# Patient Record
Sex: Female | Born: 1995 | Race: Black or African American | Hispanic: No | Marital: Single | Smoking: Former smoker
Health system: Southern US, Community
[De-identification: ages and names within clinical notes are randomized; demographics above are authoritative.]

## PROBLEM LIST (undated history)

## (undated) ENCOUNTER — Inpatient Hospital Stay (HOSPITAL_COMMUNITY): Payer: Self-pay

## (undated) ENCOUNTER — Inpatient Hospital Stay: Payer: Self-pay

## (undated) DIAGNOSIS — Z789 Other specified health status: Secondary | ICD-10-CM

## (undated) HISTORY — PX: NO PAST SURGERIES: SHX2092

---

## 2016-04-26 ENCOUNTER — Emergency Department (HOSPITAL_COMMUNITY)
Admission: EM | Admit: 2016-04-26 | Discharge: 2016-04-26 | Disposition: A | Discharge status: Home / Self Care | Payer: BLUE CROSS/BLUE SHIELD | Attending: Emergency Medicine | Admitting: Emergency Medicine

## 2016-04-26 ENCOUNTER — Encounter (HOSPITAL_COMMUNITY): Payer: Self-pay | Admitting: *Deleted

## 2016-04-26 DIAGNOSIS — J069 Acute upper respiratory infection, unspecified: Secondary | ICD-10-CM | POA: Diagnosis not present

## 2016-04-26 DIAGNOSIS — R05 Cough: Secondary | ICD-10-CM | POA: Diagnosis present

## 2016-04-26 DIAGNOSIS — J0100 Acute maxillary sinusitis, unspecified: Secondary | ICD-10-CM | POA: Diagnosis not present

## 2016-04-26 DIAGNOSIS — F17200 Nicotine dependence, unspecified, uncomplicated: Secondary | ICD-10-CM | POA: Diagnosis not present

## 2016-04-26 LAB — INFLUENZA PANEL BY PCR (TYPE A & B)
H1N1 flu by pcr: NOT DETECTED
Influenza A By PCR: NEGATIVE
Influenza B By PCR: NEGATIVE

## 2016-04-26 LAB — RAPID STREP SCREEN (MED CTR MEBANE ONLY): STREPTOCOCCUS, GROUP A SCREEN (DIRECT): NEGATIVE

## 2016-04-26 MED ORDER — BENZONATATE 200 MG PO CAPS: 200.0000 mg | ORAL_CAPSULE | Freq: Three times a day (TID) | ORAL | 0 refills | Status: DC | PRN

## 2016-04-26 MED ORDER — NAPROXEN 375 MG PO TABS: 375.0000 mg | ORAL_TABLET | Freq: Two times a day (BID) | ORAL | 0 refills | Status: DC

## 2016-04-26 MED ORDER — IBUPROFEN 400 MG PO TABS
600.0000 mg | ORAL_TABLET | Freq: Once | ORAL | Status: AC
  Administered 2016-04-26: 600 mg via ORAL
  Filled 2016-04-26: qty 1

## 2016-04-26 NOTE — Discharge Instructions (Signed)
Take the medication for cough and the medication for fever and aching as needed. Follow up with your doctor or return for worsening symptoms.

## 2016-04-26 NOTE — ED Notes (Signed)
PA at bedside.

## 2016-04-26 NOTE — ED Provider Notes (Signed)
[facility]-EMERGENCY DEPT Provider Note   CSN: [id number] Arrival date & time: 04/26/16  1715  By signing my name below, I, Phillis HaggisGabriella Gaje, attest that this documentation has been prepared under the direction and in the presence of [facility] Ne[facility], NP-C. Electronically Signed: Phillis HaggisGabriella Gaje, ED Scribe. 04/26/16. 9:06 PM.  History   Chief Complaint Chief Complaint  Patient presents with  . Generalized Body Aches  . Cough  . URI   The history is provided by the patient. No language interpreter was used.  Cough  This is a new problem. The current episode started yesterday. The problem has been gradually worsening. The cough is productive of sputum. There has been no fever. Associated symptoms include chills, sore throat and myalgias. Pertinent negatives include no chest pain, no shortness of breath and no wheezing. She has tried nothing for the symptoms.   HPI Comments: Emma Watson is a 20 y.o. female who presents to the Emergency Department complaining of gradually worsening cough onset one day ago. She reports associated generalized myalgias, nausea, lower back pain, subjective fever, chills, fatigue and sore throat. Pt has a temperature of 99.3 F in the ED. She used peppermint tea and cough drops for her symptoms to mild relief. Pt has been around sick contacts at home. She says that she typically has similar symptoms around the same time every year. She denies vomiting or diarrhea.   History reviewed. No pertinent past medical history.  There are no active problems to display for this patient.   History reviewed. No pertinent surgical history.  OB History    No data available     Home Medications    Prior to Admission medications   Medication Sig Start Date End Date Taking? Authorizing Provider  benzonatate (TESSALON) 200 MG capsule Take 1 capsule (200 mg total) by mouth 3 (three) times daily as needed for cough. 04/26/16   Hope Orlene OchM Neese, NP  naproxen (NAPROSYN) 375 MG tablet Take 1  tablet (375 mg total) by mouth 2 (two) times daily. 04/26/16   Hope Orlene OchM Neese, NP    Family History History reviewed. No pertinent family history.  Social History Social History  Substance Use Topics  . Smoking status: Current Every Day Smoker  . Smokeless tobacco: Never Used  . Alcohol use Yes     Allergies   Review of patient's allergies indicates no known allergies.   Review of Systems Review of Systems  Constitutional: Positive for chills and fatigue. Negative for fever.  HENT: Positive for sore throat.   Respiratory: Positive for cough. Negative for shortness of breath and wheezing.   Cardiovascular: Negative for chest pain.  Gastrointestinal: Positive for nausea. Negative for diarrhea and vomiting.  Musculoskeletal: Positive for back pain and myalgias.  Neurological: Negative for dizziness and light-headedness.   Physical Exam Updated Vital Signs BP 109/68 (BP Location: Right Arm)   Pulse 80   Temp 99 F (37.2 C) (Oral)   Resp 20   LMP 04/15/2016 (Exact Date)   SpO2 99%   Physical Exam  Constitutional: She is oriented to person, place, and time. She appears well-developed and well-nourished.  HENT:  Head: Normocephalic and atraumatic.  Right Ear: Tympanic membrane normal.  Left Ear: Tympanic membrane normal.  Nose: Rhinorrhea present. Right sinus exhibits maxillary sinus tenderness. Left sinus exhibits maxillary sinus tenderness.  Mouth/Throat: Uvula is midline and mucous membranes are normal. Posterior oropharyngeal erythema present. No oropharyngeal exudate or posterior oropharyngeal edema.  Nasal congestion present   Eyes: EOM are  normal. Pupils are equal, round, and reactive to light.  Neck: Normal range of motion. Neck supple.  Slightly enlarged anterior cervical nodes  Cardiovascular: Normal rate and regular rhythm.   Pulmonary/Chest: Effort normal and breath sounds normal. No respiratory distress.  Abdominal: Soft. Bowel sounds are normal. There is no  tenderness.  Genitourinary: Rectal exam shows guaiac negative stool.  Musculoskeletal: Normal range of motion.  Scoliosis noted  Neurological: She is alert and oriented to person, place, and time.  Skin: Skin is warm and dry.  Psychiatric: She has a normal mood and affect. Her behavior is normal.  Nursing note and vitals reviewed.  ED Treatments / Results  DIAGNOSTIC STUDIES: Oxygen Saturation is 97% on RA, normal by my interpretation.    COORDINATION OF CARE: 7:36 PM-Discussed treatment plan which includes strep screen and flu test with pt at bedside and pt agreed to plan.   7:09 PM- went into room and pt was not yet placed in the room  7:21 PM- pt is now roomed  Labs (all labs ordered are listed, but only abnormal results are displayed) Labs Reviewed  RAPID STREP SCREEN (NOT AT [facility])  CULTURE, GROUP A STREP [facility])  INFLUENZA PANEL BY PCR (TYPE A & B, H1N1)    Radiology No results found.  Procedures Procedures (including critical care time)  Medications Ordered in ED Medications  ibuprofen (ADVIL,MOTRIN) tablet 600 mg (600 mg Oral Given 04/26/16 2018)   Initial Impression / Assessment and Plan / ED Course  I have reviewed the triage vital signs and the nursing notes.  Pertinent lab results that were available during my care of the patient were reviewed by me and considered in my medical decision making (see chart for details).  Clinical Course    Final Clinical Impressions(s) / ED Diagnoses   Pt strep screen and flu test is negative. Patients symptoms are consistent with URI, likely viral etiology. Discussed that antibiotics are not indicated for viral infections. Pt will be discharged with symptomatic treatment.  Verbalizes understanding and is agreeable with plan. Pt is hemodynamically stable & in NAD prior to dc.  Final diagnoses:  Viral upper respiratory tract infection   I personally performed the services described in this documentation, which was scribed in  my presence. The recorded information has been reviewed and is accurate.   New Prescriptions Discharge Medication List as of 04/26/2016  9:47 PM    START taking these medications   Details  benzonatate (TESSALON) 200 MG capsule Take 1 capsule (200 mg total) by mouth 3 (three) times daily as needed for cough., Starting Wed 04/26/2016, Print    naproxen (NAPROSYN) 375 MG tablet Take 1 tablet (375 mg total) by mouth 2 (two) times daily., Starting Wed 04/26/2016, [address] [place], NP 04/27/16 9147    Alvira Monday, MD 04/27/16 1309

## 2016-04-26 NOTE — ED Triage Notes (Signed)
Pt c/o cold symptoms starting today. Pt has not taken any OTC medications for her symptoms.

## 2016-04-26 NOTE — ED Notes (Signed)
Pt departed in NAD, refused use of wheelchair.  

## 2016-04-29 LAB — CULTURE, GROUP A STREP (THRC)

## 2016-08-19 ENCOUNTER — Ambulatory Visit (HOSPITAL_COMMUNITY)
Admission: EM | Admit: 2016-08-19 | Discharge: 2016-08-19 | Disposition: A | Discharge status: Home / Self Care | Payer: BLUE CROSS/BLUE SHIELD | Attending: Family Medicine | Admitting: Family Medicine

## 2016-08-19 ENCOUNTER — Encounter (HOSPITAL_COMMUNITY): Payer: Self-pay | Admitting: Emergency Medicine

## 2016-08-19 DIAGNOSIS — R69 Illness, unspecified: Secondary | ICD-10-CM

## 2016-08-19 DIAGNOSIS — M545 Low back pain, unspecified: Secondary | ICD-10-CM

## 2016-08-19 DIAGNOSIS — R05 Cough: Secondary | ICD-10-CM | POA: Diagnosis not present

## 2016-08-19 DIAGNOSIS — J111 Influenza due to unidentified influenza virus with other respiratory manifestations: Secondary | ICD-10-CM

## 2016-08-19 DIAGNOSIS — R059 Cough, unspecified: Secondary | ICD-10-CM

## 2016-08-19 MED ORDER — ONDANSETRON 4 MG PO TBDP: 4.0000 mg | ORAL_TABLET | Freq: Three times a day (TID) | ORAL | 0 refills | Status: DC | PRN

## 2016-08-19 MED ORDER — OSELTAMIVIR PHOSPHATE 75 MG PO CAPS: 75.0000 mg | ORAL_CAPSULE | Freq: Two times a day (BID) | ORAL | 0 refills | Status: DC

## 2016-08-19 MED ORDER — GUAIFENESIN-CODEINE 100-10 MG/5ML PO SYRP: 5.0000 mL | ORAL_SOLUTION | Freq: Three times a day (TID) | ORAL | 0 refills | Status: DC | PRN

## 2016-08-19 MED ORDER — ACETAMINOPHEN 325 MG PO TABS
650.0000 mg | ORAL_TABLET | Freq: Once | ORAL | Status: AC
  Administered 2016-08-19: 650 mg via ORAL

## 2016-08-19 MED ORDER — ACETAMINOPHEN 325 MG PO TABS
ORAL_TABLET | ORAL | Status: AC
  Filled 2016-08-19: qty 2

## 2016-08-19 NOTE — ED Triage Notes (Signed)
Vomiting and diarrhea, general aches and pain, headache

## 2016-08-19 NOTE — ED Provider Notes (Signed)
CSN: [id number]     Arrival date & time 08/19/16  1958 History   None    Chief Complaint  Patient presents with  . URI   (Consider location/radiation/quality/duration/timing/severity/associated sxs/prior Treatment) Patient presents with back pain Nausea fever and flu like sx's for 2 days   The history is provided by the patient.  URI  Presenting symptoms: congestion, cough, fatigue and fever   Severity:  Moderate Onset quality:  Sudden Duration:  2 days Timing:  Constant Progression:  Worsening Chronicity:  New Relieved by:  Nothing Ineffective treatments:  None tried Associated symptoms: arthralgias, headaches, myalgias and neck pain     History reviewed. No pertinent past medical history. History reviewed. No pertinent surgical history. No family history on file. Social History  Substance Use Topics  . Smoking status: Current Every Day Smoker  . Smokeless tobacco: Never Used  . Alcohol use Yes   OB History    No data available     Review of Systems  Constitutional: Positive for appetite change, chills, fatigue and fever.  HENT: Positive for congestion.   Eyes: Negative.   Respiratory: Positive for cough.   Cardiovascular: Negative.   Gastrointestinal: Positive for nausea.  Endocrine: Negative.   Genitourinary: Negative.   Musculoskeletal: Positive for arthralgias, back pain, myalgias and neck pain.  Allergic/Immunologic: Negative.   Neurological: Positive for headaches.  Hematological: Negative.   Psychiatric/Behavioral: Negative.     Allergies  Patient has no known allergies.  Home Medications   Prior to Admission medications   Medication Sig Start Date End Date Taking? Authorizing Provider  benzonatate (TESSALON) 200 MG capsule Take 1 capsule (200 mg total) by mouth 3 (three) times daily as needed for cough. Patient not taking: Reported on 08/19/2016 04/26/16   Janne Napoleon, NP  guaiFENesin-codeine (CHERATUSSIN AC) 100-10 MG/5ML syrup Take 5 mLs by  mouth 3 (three) times daily as needed for cough. 08/19/16   Deatra Canter, FNP  naproxen (NAPROSYN) 375 MG tablet Take 1 tablet (375 mg total) by mouth 2 (two) times daily. Patient not taking: Reported on 08/19/2016 04/26/16   Janne Napoleon, NP  oseltamivir (TAMIFLU) 75 MG capsule Take 1 capsule (75 mg total) by mouth every 12 (twelve) hours. 08/19/16   Deatra Canter, FNP   Meds Ordered and Administered this Visit   Medications  acetaminophen (TYLENOL) tablet 650 mg (650 mg Oral Given 08/19/16 2047)    BP 123/86 (BP Location: Right Arm)   Pulse 95   Temp 102.4 F (39.1 C) (Oral)   Resp 18   SpO2 100%  No data found.   Physical Exam  Constitutional: She is oriented to person, place, and time. She appears well-developed and well-nourished.  HENT:  Head: Normocephalic.  Right Ear: External ear normal.  Left Ear: External ear normal.  Mouth/Throat: Oropharynx is clear and moist.  Eyes: Conjunctivae and EOM are normal. Pupils are equal, round, and reactive to light.  Neck: Normal range of motion. Neck supple.  No nuchal rigidity  Cardiovascular: Normal rate, regular rhythm and normal heart sounds.   Pulmonary/Chest: Effort normal and breath sounds normal.  Abdominal: Soft. Bowel sounds are normal.  Musculoskeletal: She exhibits tenderness.  TTP lumbar paraspinous muscles.  Neurological: She is alert and oriented to person, place, and time.  Nursing note and vitals reviewed.   Urgent Care Course     Procedures (including critical care time)  Labs Review Labs Reviewed - No data to display  Imaging Review No results  found.   Visual Acuity Review  Right Eye Distance:   Left Eye Distance:   Bilateral Distance:    Right Eye Near:   Left Eye Near:    Bilateral Near:         MDM   1. Influenza-like illness   2. Cough   3. Back pain at L4-L5 level    Tylenol here Cheratussin cough syrup Zofran odt 4mg  Tamiflu Work note Push po fluids, rest, tylenol and  motrin otc prn as directed for fever, arthralgias, and myalgias.  Follow up prn if sx's continue or persist.    Deatra CanterWilliam J Oxford, FNP 08/19/16 2103

## 2017-06-22 ENCOUNTER — Encounter (HOSPITAL_COMMUNITY): Payer: Self-pay | Admitting: Emergency Medicine

## 2017-06-22 ENCOUNTER — Other Ambulatory Visit: Payer: Self-pay

## 2017-06-22 DIAGNOSIS — R05 Cough: Secondary | ICD-10-CM | POA: Insufficient documentation

## 2017-06-22 DIAGNOSIS — Z5321 Procedure and treatment not carried out due to patient leaving prior to being seen by health care provider: Secondary | ICD-10-CM | POA: Insufficient documentation

## 2017-06-22 DIAGNOSIS — M545 Low back pain: Secondary | ICD-10-CM | POA: Insufficient documentation

## 2017-06-22 DIAGNOSIS — R0981 Nasal congestion: Secondary | ICD-10-CM | POA: Insufficient documentation

## 2017-06-22 MED ORDER — ACETAMINOPHEN 325 MG PO TABS
650.0000 mg | ORAL_TABLET | Freq: Once | ORAL | Status: AC | PRN
  Administered 2017-06-22: 650 mg via ORAL
  Filled 2017-06-22: qty 2

## 2017-06-22 NOTE — ED Notes (Signed)
Denies sore throat

## 2017-06-22 NOTE — ED Triage Notes (Signed)
C/o productive cough with yellowish- green phlegm, nasal congestion, lower back aching since Tuesday.  Reports fever up to 103 on Wednesday.  Denies nausea.  States she vomited x 1 last night after coughing.

## 2017-06-23 ENCOUNTER — Other Ambulatory Visit: Payer: Self-pay

## 2017-06-23 ENCOUNTER — Encounter (HOSPITAL_COMMUNITY): Payer: Self-pay | Admitting: *Deleted

## 2017-06-23 ENCOUNTER — Emergency Department (HOSPITAL_COMMUNITY)
Admission: EM | Admit: 2017-06-23 | Discharge: 2017-06-23 | Disposition: A | Discharge status: Home / Self Care | Payer: Self-pay | Attending: Emergency Medicine | Admitting: Emergency Medicine

## 2017-06-23 ENCOUNTER — Emergency Department (HOSPITAL_COMMUNITY)
Admission: EM | Admit: 2017-06-23 | Discharge: 2017-06-23 | Disposition: A | Discharge status: Home / Self Care | Payer: BLUE CROSS/BLUE SHIELD | Attending: Emergency Medicine | Admitting: Emergency Medicine

## 2017-06-23 DIAGNOSIS — J40 Bronchitis, not specified as acute or chronic: Secondary | ICD-10-CM | POA: Insufficient documentation

## 2017-06-23 DIAGNOSIS — R51 Headache: Secondary | ICD-10-CM | POA: Insufficient documentation

## 2017-06-23 DIAGNOSIS — F17200 Nicotine dependence, unspecified, uncomplicated: Secondary | ICD-10-CM | POA: Insufficient documentation

## 2017-06-23 DIAGNOSIS — R059 Cough, unspecified: Secondary | ICD-10-CM

## 2017-06-23 DIAGNOSIS — Z79899 Other long term (current) drug therapy: Secondary | ICD-10-CM | POA: Insufficient documentation

## 2017-06-23 DIAGNOSIS — R0989 Other specified symptoms and signs involving the circulatory and respiratory systems: Secondary | ICD-10-CM | POA: Insufficient documentation

## 2017-06-23 DIAGNOSIS — R05 Cough: Secondary | ICD-10-CM | POA: Insufficient documentation

## 2017-06-23 DIAGNOSIS — R0981 Nasal congestion: Secondary | ICD-10-CM | POA: Insufficient documentation

## 2017-06-23 DIAGNOSIS — M791 Myalgia, unspecified site: Secondary | ICD-10-CM | POA: Insufficient documentation

## 2017-06-23 LAB — URINALYSIS, ROUTINE W REFLEX MICROSCOPIC
Bacteria, UA: NONE SEEN
Bilirubin Urine: NEGATIVE
Glucose, UA: 50 mg/dL — AB
Hgb urine dipstick: NEGATIVE
KETONES UR: 5 mg/dL — AB
LEUKOCYTES UA: NEGATIVE
Nitrite: NEGATIVE
PROTEIN: 100 mg/dL — AB
Specific Gravity, Urine: 1.035 — ABNORMAL HIGH (ref 1.005–1.030)
pH: 5 (ref 5.0–8.0)

## 2017-06-23 LAB — POC URINE PREG, ED: Preg Test, Ur: NEGATIVE

## 2017-06-23 MED ORDER — AZITHROMYCIN 250 MG PO TABS
500.0000 mg | ORAL_TABLET | Freq: Once | ORAL | Status: AC
  Administered 2017-06-23: 500 mg via ORAL
  Filled 2017-06-23: qty 2

## 2017-06-23 MED ORDER — ONDANSETRON 4 MG PO TBDP
4.0000 mg | ORAL_TABLET | Freq: Once | ORAL | Status: AC
  Administered 2017-06-23: 4 mg via ORAL
  Filled 2017-06-23: qty 1

## 2017-06-23 MED ORDER — AZITHROMYCIN 250 MG PO TABS: ORAL_TABLET | ORAL | 0 refills | Status: DC

## 2017-06-23 MED ORDER — ONDANSETRON 4 MG PO TBDP: ORAL_TABLET | ORAL | 0 refills | Status: DC

## 2017-06-23 NOTE — ED Provider Notes (Signed)
[facility]   CSN: [id number] Arrival date & time: 06/23/17  2124     History   Chief Complaint Chief Complaint  Patient presents with  . Cough    HPI Emma Watson is a 21 y.o. female.  Patient complains of cough congestion for a few days   The history is provided by the patient. No language interpreter was used.  Cough  This is a new problem. The current episode started more than 2 days ago. The problem occurs constantly. The problem has not changed since onset.The cough is productive of sputum. The maximum temperature recorded prior to her arrival was 100 to 100.9 F. The fever has been present for 1 to 2 days. Associated symptoms include headaches and myalgias. Pertinent negatives include no chest pain.    History reviewed. No pertinent past medical history.  There are no active problems to display for this patient.   History reviewed. No pertinent surgical history.  OB History    No data available       Home Medications    Prior to Admission medications   Medication Sig Start Date End Date Taking? Authorizing Provider  azithromycin (ZITHROMAX Z-PAK) 250 MG tablet 1 po q day 06/23/17   Bethann BerkshireZammit, Mckynzi Cammon, MD  benzonatate (TESSALON) 200 MG capsule Take 1 capsule (200 mg total) by mouth 3 (three) times daily as needed for cough. Patient not taking: Reported on 08/19/2016 04/26/16   Janne NapoleonNeese, Hope M, NP  guaiFENesin-codeine [facility](CHERATUSSIN AC) 100-10 MG/5ML syrup Take 5 mLs by mouth 3 (three) times daily as needed for cough. 08/19/16   Deatra Canterxford, William J, FNP  naproxen (NAPROSYN) 375 MG tablet Take 1 tablet (375 mg total) by mouth 2 (two) times daily. Patient not taking: Reported on 08/19/2016 04/26/16   Janne NapoleonNeese, Hope M, NP  ondansetron (ZOFRAN ODT) 4 MG disintegrating tablet Take 1 tablet (4 mg total) by mouth every 8 (eight) hours as needed for nausea or vomiting. 08/19/16   Deatra Canterxford, William J, FNP  oseltamivir (TAMIFLU) 75 MG  capsule Take 1 capsule (75 mg total) by mouth every 12 (twelve) hours. 08/19/16   Deatra Canterxford, William J, FNP    Family History No family history on file.  Social History Social History   Tobacco Use  . Smoking status: Current Every Day Smoker  . Smokeless tobacco: Never Used  Substance Use Topics  . Alcohol use: Yes  . Drug use: Yes    Types: Marijuana     Allergies   Patient has no known allergies.   Review of Systems Review of Systems  Constitutional: Negative for appetite change and fatigue.  HENT: Negative for congestion, ear discharge and sinus pressure.   Eyes: Negative for discharge.  Respiratory: Positive for cough.   Cardiovascular: Negative for chest pain.  Gastrointestinal: Negative for abdominal pain and diarrhea.  Genitourinary: Negative for frequency and hematuria.  Musculoskeletal: Positive for myalgias. Negative for back pain.  Skin: Negative for rash.  Neurological: Positive for headaches. Negative for seizures.  Psychiatric/Behavioral: Negative for hallucinations.     Physical Exam Updated Vital Signs BP (!) 128/95   Pulse (!) 104   Temp 99.4 F (37.4 C)   Resp 16   Ht 5\' 6"  (1.676 m)   Wt 59 kg (130 lb)   LMP 05/28/2017   SpO2 98%   BMI 20.98 kg/m   Physical Exam  Constitutional: She is oriented to person, place, and time. She appears well-developed.  HENT:  Head: Normocephalic.  Eyes: Conjunctivae and EOM are normal. No scleral icterus.  Neck: Neck supple. No thyromegaly present.  Cardiovascular: Normal rate and regular rhythm. Exam reveals no gallop and no friction rub.  No murmur heard. Pulmonary/Chest: No stridor. She has no wheezes. She has rales. She exhibits no tenderness.  Abdominal: She exhibits no distension. There is no tenderness. There is no rebound.  Musculoskeletal: Normal range of motion. She exhibits no edema.  Lymphadenopathy:    She has no cervical adenopathy.  Neurological: She is oriented to person, place, and time.  She exhibits normal muscle tone. Coordination normal.  Skin: No rash noted. No erythema.  Psychiatric: She has a normal mood and affect. Her behavior is normal.     ED Treatments / Results  Labs (all labs ordered are listed, but only abnormal results are displayed) Labs Reviewed - No data to display  EKG  EKG Interpretation None       Radiology No results found.  Procedures Procedures (including critical care time)  Medications Ordered in ED Medications  azithromycin (ZITHROMAX) tablet 500 mg (500 mg Oral Given 06/23/17 2311)     Initial Impression / Assessment and Plan / ED Course  I have reviewed the triage vital signs and the nursing notes.  Pertinent labs & imaging results that were available during my care of the patient were reviewed by me and considered in my medical decision making (see chart for details).     Patient with a respiratory infection.  She will be placed on Zithromax and told to drink fluids and take Tylenol or Motrin for aches and fever  Final Clinical Impressions(s) / ED Diagnoses   Final diagnoses:  Cough  Bronchitis    ED Discharge Orders        Ordered    azithromycin (ZITHROMAX Z-PAK) 250 MG tablet     06/23/17 2321       Bethann BerkshireZammit, Raydan Schlabach, MD 06/23/17 2324

## 2017-06-23 NOTE — ED Notes (Signed)
Pt is called for xray in lobby but no answer

## 2017-06-23 NOTE — ED Triage Notes (Signed)
Cold cough with chest congestion and elevated temp productive  coough light yellow  lmp last month 2n

## 2017-06-23 NOTE — Discharge Instructions (Signed)
Drink plenty of fluids take Tylenol or Motrin for aches and fever follow-up in 3-4 days if not improving

## 2017-06-23 NOTE — ED Notes (Signed)
Multiple staff members attempted to call patient. No response [phone]. Pt removed from system.

## 2017-06-28 NOTE — ED Notes (Signed)
Pt. Called and requested a work note for her visit on 06/23/2017.  Work note left at [employer] [facility].  Pt. Informed where to pick up the Work note. 06/28/2017 15:43

## 2017-07-07 ENCOUNTER — Other Ambulatory Visit: Payer: Self-pay

## 2017-07-07 ENCOUNTER — Emergency Department (HOSPITAL_COMMUNITY)
Admission: EM | Admit: 2017-07-07 | Discharge: 2017-07-08 | Disposition: A | Discharge status: Home / Self Care | Payer: No Typology Code available for payment source | Attending: Emergency Medicine | Admitting: Emergency Medicine

## 2017-07-07 ENCOUNTER — Encounter (HOSPITAL_COMMUNITY): Payer: Self-pay

## 2017-07-07 DIAGNOSIS — F17200 Nicotine dependence, unspecified, uncomplicated: Secondary | ICD-10-CM | POA: Insufficient documentation

## 2017-07-07 DIAGNOSIS — T148XXA Other injury of unspecified body region, initial encounter: Secondary | ICD-10-CM | POA: Diagnosis not present

## 2017-07-07 DIAGNOSIS — Y939 Activity, unspecified: Secondary | ICD-10-CM | POA: Insufficient documentation

## 2017-07-07 DIAGNOSIS — Y998 Other external cause status: Secondary | ICD-10-CM | POA: Diagnosis not present

## 2017-07-07 DIAGNOSIS — S199XXA Unspecified injury of neck, initial encounter: Secondary | ICD-10-CM | POA: Diagnosis present

## 2017-07-07 DIAGNOSIS — Y92410 Unspecified street and highway as the place of occurrence of the external cause: Secondary | ICD-10-CM | POA: Diagnosis not present

## 2017-07-07 DIAGNOSIS — T07XXXA Unspecified multiple injuries, initial encounter: Secondary | ICD-10-CM

## 2017-07-07 LAB — BASIC METABOLIC PANEL
Anion gap: 12 (ref 5–15)
BUN: 7 mg/dL (ref 6–20)
CHLORIDE: 101 mmol/L (ref 101–111)
CO2: 24 mmol/L (ref 22–32)
CREATININE: 0.68 mg/dL (ref 0.44–1.00)
Calcium: 9.2 mg/dL (ref 8.9–10.3)
GFR calc Af Amer: >60 mL/min (ref >60)
GFR calc non Af Amer: >60 mL/min (ref >60)
GLUCOSE: 82 mg/dL (ref 65–99)
POTASSIUM: 3 mmol/L — AB (ref 3.5–5.1)
Sodium: 137 mmol/L (ref 135–145)

## 2017-07-07 LAB — I-STAT TROPONIN, ED: Troponin i, poc: 0 ng/mL (ref 0.00–0.08)

## 2017-07-07 LAB — I-STAT BETA HCG BLOOD, ED (MC, WL, AP ONLY): I-stat hCG, quantitative: <5 m[IU]/mL (ref <5)

## 2017-07-07 LAB — CBC
HEMATOCRIT: 38.6 % (ref 36.0–46.0)
Hemoglobin: 12.6 g/dL (ref 12.0–15.0)
MCH: 30.3 pg (ref 26.0–34.0)
MCHC: 32.6 g/dL (ref 30.0–36.0)
MCV: 92.8 fL (ref 78.0–100.0)
PLATELETS: 311 10*3/uL (ref 150–400)
RBC: 4.16 MIL/uL (ref 3.87–5.11)
RDW: 14.7 % (ref 11.5–15.5)
WBC: 10.2 10*3/uL (ref 4.0–10.5)

## 2017-07-07 MED ORDER — IBUPROFEN 200 MG PO TABS
600.0000 mg | ORAL_TABLET | Freq: Once | ORAL | Status: AC
  Administered 2017-07-08: 600 mg via ORAL
  Filled 2017-07-07: qty 1

## 2017-07-07 MED ORDER — HYDROCODONE-ACETAMINOPHEN 5-325 MG PO TABS
1.0000 | ORAL_TABLET | Freq: Once | ORAL | Status: AC
  Administered 2017-07-08: 1 via ORAL
  Filled 2017-07-07: qty 1
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
## 2017-07-07 NOTE — ED Notes (Signed)
Patient transported to X-ray 

## 2017-07-07 NOTE — ED Notes (Signed)
EMT currently drawing labs 

## 2017-07-08 ENCOUNTER — Emergency Department (HOSPITAL_COMMUNITY): Payer: No Typology Code available for payment source

## 2017-07-08 LAB — D-DIMER, QUANTITATIVE (NOT AT ARMC)

## 2017-07-08 MED ORDER — IBUPROFEN 600 MG PO TABS: 600.0000 mg | ORAL_TABLET | Freq: Four times a day (QID) | ORAL | 0 refills | Status: DC | PRN

## 2017-07-08 MED ORDER — POTASSIUM CHLORIDE CRYS ER 20 MEQ PO TBCR
40.0000 meq | EXTENDED_RELEASE_TABLET | Freq: Once | ORAL | Status: AC
  Administered 2017-07-08: 40 meq via ORAL
  Filled 2017-07-08: qty 2
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
## 2017-07-08 NOTE — Discharge Instructions (Signed)
Your x-rays are negative.  Take the anti-inflammatory as prescribed.  Follow-up with your doctor.  Return to the ED if you develop new or worsening symptoms.

## 2018-01-26 ENCOUNTER — Encounter (HOSPITAL_COMMUNITY): Payer: Self-pay | Admitting: *Deleted

## 2018-01-26 ENCOUNTER — Inpatient Hospital Stay (HOSPITAL_COMMUNITY)
Admission: AD | Admit: 2018-01-26 | Discharge: 2018-01-26 | Disposition: A | Discharge status: Home / Self Care | Payer: Self-pay | Source: Ambulatory Visit | Attending: Obstetrics and Gynecology | Admitting: Obstetrics and Gynecology

## 2018-01-26 ENCOUNTER — Inpatient Hospital Stay (HOSPITAL_COMMUNITY): Payer: Self-pay

## 2018-01-26 DIAGNOSIS — O4690 Antepartum hemorrhage, unspecified, unspecified trimester: Secondary | ICD-10-CM

## 2018-01-26 DIAGNOSIS — O209 Hemorrhage in early pregnancy, unspecified: Secondary | ICD-10-CM | POA: Insufficient documentation

## 2018-01-26 DIAGNOSIS — O4691 Antepartum hemorrhage, unspecified, first trimester: Secondary | ICD-10-CM

## 2018-01-26 DIAGNOSIS — Z3A01 Less than 8 weeks gestation of pregnancy: Secondary | ICD-10-CM | POA: Insufficient documentation

## 2018-01-26 DIAGNOSIS — O418X91 Other specified disorders of amniotic fluid and membranes, unspecified trimester, fetus 1: Secondary | ICD-10-CM

## 2018-01-26 DIAGNOSIS — O468X1 Other antepartum hemorrhage, first trimester: Secondary | ICD-10-CM

## 2018-01-26 DIAGNOSIS — O418X90 Other specified disorders of amniotic fluid and membranes, unspecified trimester, not applicable or unspecified: Secondary | ICD-10-CM

## 2018-01-26 DIAGNOSIS — A5901 Trichomonal vulvovaginitis: Secondary | ICD-10-CM | POA: Insufficient documentation

## 2018-01-26 DIAGNOSIS — Z87891 Personal history of nicotine dependence: Secondary | ICD-10-CM | POA: Insufficient documentation

## 2018-01-26 DIAGNOSIS — O98311 Other infections with a predominantly sexual mode of transmission complicating pregnancy, first trimester: Secondary | ICD-10-CM | POA: Insufficient documentation

## 2018-01-26 DIAGNOSIS — O468X9 Other antepartum hemorrhage, unspecified trimester: Secondary | ICD-10-CM

## 2018-01-26 HISTORY — DX: Other specified health status: Z78.9

## 2018-01-26 LAB — COMPREHENSIVE METABOLIC PANEL
ALBUMIN: 3.9 g/dL (ref 3.5–5.0)
ALK PHOS: 63 U/L (ref 38–126)
ALT: 12 U/L (ref 0–44)
ANION GAP: 8 (ref 5–15)
AST: 21 U/L (ref 15–41)
BILIRUBIN TOTAL: 0.8 mg/dL (ref 0.3–1.2)
BUN: 7 mg/dL (ref 6–20)
CALCIUM: 8.9 mg/dL (ref 8.9–10.3)
CO2: 23 mmol/L (ref 22–32)
Chloride: 105 mmol/L (ref 98–111)
Creatinine, Ser: 0.73 mg/dL (ref 0.44–1.00)
GFR calc Af Amer: >60 mL/min (ref >60)
GLUCOSE: 94 mg/dL (ref 70–99)
POTASSIUM: 3.7 mmol/L (ref 3.5–5.1)
Sodium: 136 mmol/L (ref 135–145)
TOTAL PROTEIN: 7.4 g/dL (ref 6.5–8.1)

## 2018-01-26 LAB — CBC WITH DIFFERENTIAL/PLATELET
BASOS PCT: 0 %
Basophils Absolute: 0 10*3/uL (ref 0.0–0.1)
Eosinophils Absolute: 0.1 10*3/uL (ref 0.0–0.7)
Eosinophils Relative: 1 %
HEMATOCRIT: 38.5 % (ref 36.0–46.0)
HEMOGLOBIN: 12.8 g/dL (ref 12.0–15.0)
LYMPHS ABS: 2.3 10*3/uL (ref 0.7–4.0)
LYMPHS PCT: 24 %
MCH: 30.8 pg (ref 26.0–34.0)
MCHC: 33.2 g/dL (ref 30.0–36.0)
MCV: 92.5 fL (ref 78.0–100.0)
Monocytes Absolute: 0.3 10*3/uL (ref 0.1–1.0)
Monocytes Relative: 3 %
NEUTROS ABS: 6.8 10*3/uL (ref 1.7–7.7)
NEUTROS PCT: 72 %
Platelets: 249 10*3/uL (ref 150–400)
RBC: 4.16 MIL/uL (ref 3.87–5.11)
RDW: 14.5 % (ref 11.5–15.5)
WBC: 9.5 10*3/uL (ref 4.0–10.5)

## 2018-01-26 LAB — URINALYSIS, ROUTINE W REFLEX MICROSCOPIC
Bilirubin Urine: NEGATIVE
GLUCOSE, UA: NEGATIVE mg/dL
Ketones, ur: NEGATIVE mg/dL
Nitrite: NEGATIVE
PH: 5 (ref 5.0–8.0)
Protein, ur: NEGATIVE mg/dL
SPECIFIC GRAVITY, URINE: 1.02 (ref 1.005–1.030)
WBC, UA: >50 WBC/hpf — ABNORMAL HIGH (ref 0–5)

## 2018-01-26 LAB — WET PREP, GENITAL
Clue Cells Wet Prep HPF POC: NONE SEEN
SPERM: NONE SEEN
YEAST WET PREP: NONE SEEN

## 2018-01-26 LAB — HCG, QUANTITATIVE, PREGNANCY: hCG, Beta Chain, Quant, S: 20361 m[IU]/mL — ABNORMAL HIGH (ref <5)

## 2018-01-26 LAB — POCT PREGNANCY, URINE: Preg Test, Ur: POSITIVE — AB

## 2018-01-26 MED ORDER — METRONIDAZOLE 500 MG PO TABS
2000.0000 mg | ORAL_TABLET | Freq: Once | ORAL | Status: AC
  Administered 2018-01-26: 2000 mg via ORAL
  Filled 2018-01-26: qty 4

## 2018-01-26 NOTE — MAU Provider Note (Signed)
History     CSN: [id number]  Arrival date and time: 01/26/18 2039   First Provider Initiated Contact with Patient 01/26/18 2115      Chief Complaint  Patient presents with  . Vaginal Bleeding    [name] is a 22 y.o. female G1P0 @ 33w0dHere in MAU with vaginal bleeding.   Vaginal Bleeding  The patient's primary symptoms include vaginal bleeding. This is a new problem. The current episode started today. The problem occurs 2 to 4 times per day. The problem has been waxing and waning. The patient is experiencing no pain. She is pregnant. Pertinent negatives include no abdominal pain. The vaginal discharge was bloody and dark. The vaginal bleeding is spotting. She has not been passing clots. She has not been passing tissue. Nothing aggravates the symptoms. She has tried nothing for the symptoms. She uses nothing for contraception. Her menstrual history has been regular.    OB History    Gravida  1   Para      Term      Preterm      AB      Living        SAB      TAB      Ectopic      Multiple      Live Births              Past Medical History:  Diagnosis Date  . Medical history non-contributory     Past Surgical History:  Procedure Laterality Date  . NO PAST SURGERIES      No family history on file.  Social History   Tobacco Use  . Smoking status: Former Smoker    Types: Cigars    Last attempt to quit: 01/21/2018    Years since quitting: 0.0  . Smokeless tobacco: Never Used  Substance Use Topics  . Alcohol use: Not Currently  . Drug use: Not Currently    Types: Marijuana    Allergies: No Known Allergies  Medications Prior to Admission  Medication Sig Dispense Refill Last Dose  . azithromycin (ZITHROMAX Z-PAK) 250 MG tablet 1 po q day 4 tablet 0   . benzonatate (TESSALON) 200 MG capsule Take 1 capsule (200 mg total) by mouth 3 (three) times daily as needed for cough. (Patient not taking: Reported on 08/19/2016) 20 capsule 0 Not Taking  at Unknown time  . guaiFENesin-codeine (CHERATUSSIN AC) 100-10 MG/5ML syrup Take 5 mLs by mouth 3 (three) times daily as needed for cough. 120 mL 0   . ibuprofen (ADVIL,MOTRIN) 600 MG tablet Take 1 tablet (600 mg total) by mouth every 6 (six) hours as needed. 30 tablet 0   . naproxen (NAPROSYN) 375 MG tablet Take 1 tablet (375 mg total) by mouth 2 (two) times daily. (Patient not taking: Reported on 08/19/2016) 20 tablet 0 Not Taking at Unknown time  . ondansetron (ZOFRAN ODT) 4 MG disintegrating tablet 430mODT q4 hours prn nausea/vomit 10 tablet 0   . oseltamivir (TAMIFLU) 75 MG capsule Take 1 capsule (75 mg total) by mouth every 12 (twelve) hours. 10 capsule 0    Results for orders placed or performed during the hospital encounter of 01/26/18 (from the past 48 hour(s))  Urinalysis, Routine w reflex microscopic     Status: Abnormal   Collection Time: 01/26/18  8:52 PM  Result Value Ref Range   Color, Urine YELLOW YELLOW   APPearance HAZY (A) CLEAR   Specific Gravity, Urine 1.020 1.005 -  1.030   pH 5.0 5.0 - 8.0   Glucose, UA NEGATIVE NEGATIVE mg/dL   Hgb urine dipstick MODERATE (A) NEGATIVE   Bilirubin Urine NEGATIVE NEGATIVE   Ketones, ur NEGATIVE NEGATIVE mg/dL   Protein, ur NEGATIVE NEGATIVE mg/dL   Nitrite NEGATIVE NEGATIVE   Leukocytes, UA LARGE (A) NEGATIVE   RBC / HPF 21-50 0 - 5 RBC/hpf   WBC, UA >50 (H) 0 - 5 WBC/hpf   Bacteria, UA RARE (A) NONE SEEN   Squamous Epithelial / LPF 0-5 0 - 5   Mucus PRESENT     Comment: Performed at [facility], [address], [place], [place] [address]  Pregnancy, urine POC     Status: Abnormal   Collection Time: 01/26/18  9:19 PM  Result Value Ref Range   Preg Test, Ur POSITIVE (A) NEGATIVE    Comment:        THE SENSITIVITY OF THIS METHODOLOGY IS >24 mIU/mL   Wet prep, genital     Status: Abnormal   Collection Time: 01/26/18  9:23 PM  Result Value Ref Range   Yeast Wet Prep HPF POC NONE SEEN NONE SEEN   Trich, Wet Prep PRESENT (A)  NONE SEEN   Clue Cells Wet Prep HPF POC NONE SEEN NONE SEEN   WBC, Wet Prep HPF POC FEW (A) NONE SEEN    Comment: MODERATE BACTERIA SEEN   Sperm NONE SEEN     Comment: Performed at [facility], [address] [place], [place] [address]  CBC with Differential/Platelet     Status: None   Collection Time: 01/26/18  9:31 PM  Result Value Ref Range   WBC 9.5 4.0 - 10.5 K/uL   RBC 4.16 3.87 - 5.11 MIL/uL   Hemoglobin 12.8 12.0 - 15.0 g/dL   HCT 38.5 36.0 - 46.0 %   MCV 92.5 78.0 - 100.0 fL   MCH 30.8 26.0 - 34.0 pg   MCHC 33.2 30.0 - 36.0 g/dL   RDW 14.5 11.5 - 15.5 %   Platelets 249 150 - 400 K/uL   Neutrophils Relative % 72 %   Neutro Abs 6.8 1.7 - 7.7 K/uL   Lymphocytes Relative 24 %   Lymphs Abs 2.3 0.7 - 4.0 K/uL   Monocytes Relative 3 %   Monocytes Absolute 0.3 0.1 - 1.0 K/uL   Eosinophils Relative 1 %   Eosinophils Absolute 0.1 0.0 - 0.7 K/uL   Basophils Relative 0 %   Basophils Absolute 0.0 0.0 - 0.1 K/uL    Comment: Performed at [facility], [address] [place],  [address]  Comprehensive metabolic panel     Status: None   Collection Time: 01/26/18  9:31 PM  Result Value Ref Range   Sodium 136 135 - 145 mmol/L   Potassium 3.7 3.5 - 5.1 mmol/L   Chloride 105 98 - 111 mmol/L    Comment: Please note change in reference range.   CO2 23 22 - 32 mmol/L   Glucose, Bld 94 70 - 99 mg/dL    Comment: Please note change in reference range.   BUN 7 6 - 20 mg/dL    Comment: Please note change in reference range.   Creatinine, Ser 0.73 0.44 - 1.00 mg/dL   Calcium 8.9 8.9 - 10.3 mg/dL   Total Protein 7.4 6.5 - 8.1 g/dL   Albumin 3.9 3.5 - 5.0 g/dL   AST 21 15 - 41 U/L   ALT 12 0 - 44 U/L  Comment: Please note change in reference range.   Alkaline Phosphatase 63 38 - 126 U/L   Total Bilirubin 0.8 0.3 - 1.2 mg/dL   GFR calc non Af Amer >60 >60 mL/min   GFR calc Af Amer >60 >60 mL/min    Comment: (NOTE) The eGFR has been calculated using the CKD EPI  equation. This calculation has not been validated in all clinical situations. eGFR's persistently <60 mL/min signify possible Chronic Kidney Disease.    Anion gap 8 5 - 15    Comment: Performed at [facility], [address], [place], [place] [address]  ABO/Rh     Status: None (Preliminary result)   Collection Time: 01/26/18  9:31 PM  Result Value Ref Range   ABO/RH(D)      O POS Performed at [facility], [address], [place], [place] [address]   hCG, quantitative, pregnancy     Status: Abnormal   Collection Time: 01/26/18  9:31 PM  Result Value Ref Range   hCG, Beta Chain, Quant, S 20,361 (H) <5 mIU/mL    Comment:          GEST. AGE      CONC.  (mIU/mL)   <=1 WEEK        5 - 50     2 WEEKS       50 - 500     3 WEEKS       100 - 10,000     4 WEEKS     1,000 - 30,000     5 WEEKS     3,500 - 115,000   6-8 WEEKS     12,000 - 270,000    12 WEEKS     15,000 - 220,000        FEMALE AND NON-PREGNANT FEMALE:     LESS THAN 5 mIU/mL Performed at [facility], [address] [place], [place] [address]    US Ob Less Than 14 Weeks With Ob Transvaginal  Result Date: 01/26/2018 CLINICAL DATA:  Vaginal bleeding pending HCG EXAM: OBSTETRIC <14 WK [place] AND TRANSVAGINAL OB US TECHNIQUE: Both transabdominal and transvaginal ultrasound examinations were performed for complete evaluation of the gestation as well as the maternal uterus, adnexal regions, and pelvic cul-de-sac. Transvaginal technique was performed to assess early pregnancy. COMPARISON:  None. FINDINGS: Intrauterine gestational sac: Single intrauterine gestational sac. Yolk sac:  Visible Embryo:  Not visible MSD: 12.6 mm   6 w   0 d Subchorionic hemorrhage: Small subchorionic hemorrhage inferior aspect of the sac. Maternal uterus/adnexae: Ovaries are within normal limits. No adnexal mass. No significant free fluid IMPRESSION: Single intrauterine gestational sac and yolk sac but no embryo. Suggest follow-up ultrasound in 10-14  days to confirm viability. Small subchorionic hemorrhage Electronically Signed   By: Donavan Foil M.D.   On: 01/26/2018 22:12   Review of Systems  Gastrointestinal: Negative for abdominal pain.  Genitourinary: Positive for vaginal bleeding.   Physical Exam   Blood pressure 119/61, pulse 83, temperature 98.1 F (36.7 C), resp. rate 18, height 5' 6"  (1.676 m), weight 112 lb (50.8 kg), last menstrual period 12/08/2017.  Physical Exam  Constitutional: She is oriented to person, place, and time. She appears well-developed and well-nourished. No distress.  HENT:  Head: Normocephalic.  Eyes: Pupils are equal, round, and reactive to light.  GI: Soft. She exhibits no distension. There is no tenderness. There is no rebound and no guarding.  Genitourinary:  Genitourinary Comments: Vagina - Small amount of dark  red blood in the vaginal canal, no odor  Cervix - No contact bleeding, no active bleeding  Bimanual exam: Cervix closed, posterior  Uterus non tender, enlarged  Adnexa non tender, no masses bilaterally GC/Chlam, wet prep done Chaperone present for exam.   Musculoskeletal: Normal range of motion.  Neurological: She is alert and oriented to person, place, and time.  Skin: Skin is warm. She is not diaphoretic.  Psychiatric: Her behavior is normal.   MAU Course  Procedures  None  MDM  O positive blood type Flagyl 2 grams given in MAU for trichomonas   Assessment and Plan   A:  1. Trichomonas vaginitis   2. Vaginal bleeding in pregnancy   3. [redacted] weeks gestation of pregnancy   4. Subchorionic hemorrhage, antepartum     P:  Discharge home with bleeding precautions Pelvic rest Return if symptoms worsen Discussed [place] in detail with patient Partner needs treatment/ condoms always   Aidenn Skellenger, Artist Pais, NP 01/26/2018 11:11 PM

## 2018-01-26 NOTE — MAU Note (Signed)
Spotted some at 1300 today. Saw more tonight at 2000. Has had some cramping off and on in past. No pain since Weds

## 2018-01-26 NOTE — Discharge Instructions (Signed)
Subchorionic Hematoma A subchorionic hematoma is a gathering of blood between the outer wall of the placenta and the inner wall of the womb (uterus). The placenta is the organ that connects the fetus to the wall of the uterus. The placenta performs the feeding, breathing (oxygen to the fetus), and waste removal (excretory work) of the fetus. Subchorionic hematoma is the most common abnormality found on a result from ultrasonography done during the first trimester or early second trimester of pregnancy. If there has been little or no vaginal bleeding, early small hematomas usually shrink on their own and do not affect your baby or pregnancy. The blood is gradually absorbed over 1-2 weeks. When bleeding starts later in pregnancy or the hematoma is larger or occurs in an older pregnant woman, the outcome may not be as good. Larger hematomas may get bigger, which increases the chances for miscarriage. Subchorionic hematoma also increases the risk of premature detachment of the placenta from the uterus, preterm (premature) labor, and stillbirth. Follow these instructions at home:  Stay on bed rest if your health care provider recommends this. Although bed rest will not prevent more bleeding or prevent a miscarriage, your health care provider may recommend bed rest until you are advised otherwise.  Avoid heavy lifting (more than 10 lb [4.5 kg]), exercise, sexual intercourse, or douching as directed by your health care provider.  Keep track of the number of pads you use each day and how soaked (saturated) they are. Write down this information.  Do not use tampons.  Keep all follow-up appointments as directed by your health care provider. Your health care provider may ask you to have follow-up blood tests or ultrasound tests or both. Get help right away if:  You have severe cramps in your stomach, back, abdomen, or pelvis.  You have a fever.  You pass large clots or tissue. Save any tissue for your  health care provider to look at.  Your bleeding increases or you become lightheaded, feel weak, or have fainting episodes. This information is not intended to replace advice given to you by your health care provider. Make sure you discuss any questions you have with your health care provider. Document Released: 10/25/2006 Document Revised: 12/16/2015 Document Reviewed: 02/06/2013 Elsevier Interactive Patient Education  2017 [employer] Trichomonas Test Why am I having this test? The trichomonas test is done to diagnose trichomoniasis, an infection caused by an organism called Trichomonas. Trichomoniasis is a sexually transmitted infection (STI). In women, it causes vaginal infections. In men, it can cause the tube that carries urine (urethra) to become inflamed (urethritis). You may have this test as a part of a routine screening for STIs or if you have symptoms of trichomoniasis. To perform the test, your health care provider will take a sample of discharge. The sample is taken from the vagina or cervix in women and from the urethra in men. A urine sample can also be used for testing. What do the results mean? It is your responsibility to obtain your test results. Ask the lab or department performing the test when and how you will get your results. Contact your health care provider to discuss any questions you have about your results. Negative test results A negative test means you do not have trichomoniasis. Follow your health care provider's directions about any follow-up testing. Positive test results A positive test result means you have an active infection that needs to be treated with antibiotic medicine. All your current sexual partners must also be  treated or it is likely you will get reinfected. If your test is positive, your health care provider will start you on medicine and may advise you to:  Not have sexual intercourse until your infection has cleared up.  Use a latex condom  properly every time you have sexual intercourse.  Limit the number of sexual partners you have. The more partners you have, the greater your risk of contracting trichomoniasis or another STI.  Tell all sexual partners about your infection so they can also be treated and to prevent reinfection.  Talk with your health care provider to discuss your results, treatment options, and if necessary, the need for more tests. Talk with your health care provider if you have any questions about your results. This information is not intended to replace advice given to you by your health care provider. Make sure you discuss any questions you have with your health care provider. Document Released: 08/12/2004 Document Revised: 02/09/2016 Document Reviewed: 07/22/2013 Elsevier Interactive Patient Education  2017 [employer]

## 2018-01-27 LAB — ABO/RH: ABO/RH(D): O POS

## 2018-01-27 LAB — HIV ANTIBODY (ROUTINE TESTING W REFLEX): HIV SCREEN 4TH GENERATION: NONREACTIVE

## 2018-01-28 LAB — CULTURE, OB URINE: SPECIAL REQUESTS: NORMAL

## 2018-01-28 LAB — GC/CHLAMYDIA PROBE AMP (~~LOC~~) NOT AT ARMC
Chlamydia: NEGATIVE
Neisseria Gonorrhea: NEGATIVE

## 2018-04-30 ENCOUNTER — Encounter: Payer: Self-pay | Admitting: Obstetrics and Gynecology

## 2018-04-30 ENCOUNTER — Other Ambulatory Visit: Payer: Self-pay

## 2018-04-30 ENCOUNTER — Observation Stay: Payer: Medicaid Other

## 2018-04-30 ENCOUNTER — Observation Stay
Admission: EM | Admit: 2018-04-30 | Discharge: 2018-04-30 | Disposition: A | Discharge status: Home / Self Care | Payer: Medicaid Other | Attending: Certified Nurse Midwife | Admitting: Certified Nurse Midwife

## 2018-04-30 DIAGNOSIS — O23592 Infection of other part of genital tract in pregnancy, second trimester: Secondary | ICD-10-CM | POA: Insufficient documentation

## 2018-04-30 DIAGNOSIS — O26859 Spotting complicating pregnancy, unspecified trimester: Secondary | ICD-10-CM | POA: Diagnosis present

## 2018-04-30 DIAGNOSIS — O26852 Spotting complicating pregnancy, second trimester: Principal | ICD-10-CM | POA: Insufficient documentation

## 2018-04-30 DIAGNOSIS — O0932 Supervision of pregnancy with insufficient antenatal care, second trimester: Secondary | ICD-10-CM | POA: Diagnosis not present

## 2018-04-30 DIAGNOSIS — Z3A20 20 weeks gestation of pregnancy: Secondary | ICD-10-CM | POA: Diagnosis not present

## 2018-04-30 DIAGNOSIS — Z87891 Personal history of nicotine dependence: Secondary | ICD-10-CM | POA: Diagnosis not present

## 2018-04-30 DIAGNOSIS — O4690 Antepartum hemorrhage, unspecified, unspecified trimester: Secondary | ICD-10-CM

## 2018-04-30 DIAGNOSIS — B9689 Other specified bacterial agents as the cause of diseases classified elsewhere: Secondary | ICD-10-CM | POA: Diagnosis not present

## 2018-04-30 DIAGNOSIS — O23599 Infection of other part of genital tract in pregnancy, unspecified trimester: Secondary | ICD-10-CM

## 2018-04-30 LAB — WET PREP, GENITAL
Sperm: NONE SEEN
Trich, Wet Prep: NONE SEEN
Yeast Wet Prep HPF POC: NONE SEEN

## 2018-04-30 LAB — URINALYSIS, COMPLETE (UACMP) WITH MICROSCOPIC
BILIRUBIN URINE: NEGATIVE
Bacteria, UA: NONE SEEN
Glucose, UA: NEGATIVE mg/dL
HGB URINE DIPSTICK: NEGATIVE
Ketones, ur: 5 mg/dL — AB
LEUKOCYTES UA: NEGATIVE
NITRITE: NEGATIVE
PH: 6 (ref 5.0–8.0)
Protein, ur: NEGATIVE mg/dL
SPECIFIC GRAVITY, URINE: 1.027 (ref 1.005–1.030)

## 2018-04-30 LAB — CHLAMYDIA/NGC RT PCR (ARMC ONLY)
CHLAMYDIA TR: NOT DETECTED
N GONORRHOEAE: NOT DETECTED

## 2018-04-30 MED ORDER — METRONIDAZOLE 500 MG PO TABS: 500.0000 mg | ORAL_TABLET | Freq: Two times a day (BID) | ORAL | 0 refills | Status: AC

## 2018-04-30 MED ORDER — METRONIDAZOLE 500 MG PO TABS
500.0000 mg | ORAL_TABLET | Freq: Two times a day (BID) | ORAL | Status: DC
  Administered 2018-04-30: 500 mg via ORAL
  Filled 2018-04-30: qty 1

## 2018-04-30 NOTE — Final Progress Note (Signed)
Physician Final Progress Note  Patient ID: Emma Watson MRN: [id number] DOB/AGE: 1995-08-30 22 y.o.  Admit date: 04/30/2018 Admitting provider: Natale Milch, MD/ Gasper Lloyd. Sharen Hones, CNM Discharge date: 04/30/2018   Admission Diagnoses: Vaginal spotting in pregnancy Back pain and abdominal pain in pregnancy. No prenatal care  Discharge Diagnoses:  Active Problems:   Spotting in pregnancy   Bacterial vaginosis in pregnancy   No prenatal care in current pregnancy in second trimester   Consults: None  Significant Findings/ Diagnostic Studies: 22 year old G1 P0 with EDC=09/14/2018 by unsure LMP presented with complaints of vaginal spotting last night, and intermittent abdominal pain and back pain. She had not yet started her prenatal care-has had some ambivalent feeling regarding the pregnancy.  No bleeding was seen on vaginal exam and there were clue cells on wet prep. GC and Chlamydia test were negative Urinalysis was negative. Anatomy scan was done  and CGA was 18wk6d which changed her EDC to 09/24/2017. Anatomy scan was normal but there were suboptimal views of the spine. Placenta is anterior. Blood type is O pOS Results for orders placed or performed during the hospital encounter of 04/30/18 (from the past 24 hour(s))  Wet prep, genital     Status: Abnormal   Collection Time: 04/30/18  7:43 AM  Result Value Ref Range   Yeast Wet Prep HPF POC NONE SEEN NONE SEEN   Trich, Wet Prep NONE SEEN NONE SEEN   Clue Cells Wet Prep HPF POC PRESENT (A) NONE SEEN   WBC, Wet Prep HPF POC FEW (A) NONE SEEN   Sperm NONE SEEN   Chlamydia/NGC rt PCR ([facility] only)     Status: None   Collection Time: 04/30/18  7:43 AM  Result Value Ref Range   Specimen source GC/Chlam ENDOCERVICAL    Chlamydia Tr NOT DETECTED NOT DETECTED   N gonorrhoeae NOT DETECTED NOT DETECTED  Urinalysis, Complete w Microscopic     Status: Abnormal   Collection Time: 04/30/18  7:43 AM  Result Value Ref Range   Color,  Urine YELLOW (A) YELLOW   APPearance CLEAR (A) CLEAR   Specific Gravity, Urine 1.027 1.005 - 1.030   pH 6.0 5.0 - 8.0   Glucose, UA NEGATIVE NEGATIVE mg/dL   Hgb urine dipstick NEGATIVE NEGATIVE   Bilirubin Urine NEGATIVE NEGATIVE   Ketones, ur 5 (A) NEGATIVE mg/dL   Protein, ur NEGATIVE NEGATIVE mg/dL   Nitrite NEGATIVE NEGATIVE   Leukocytes, UA NEGATIVE NEGATIVE   RBC / HPF 0-5 0 - 5 RBC/hpf   WBC, UA 0-5 0 - 5 WBC/hpf   Bacteria, UA NONE SEEN NONE SEEN   Squamous Epithelial / LPF 0-5 0 - 5   Mucus PRESENT   US Ob Comp + 14 Wk  Result Date: 04/30/2018 CLINICAL DATA:  Vaginal bleeding since yesterday morning EXAM: OBSTETRICAL ULTRASOUND >14 WKS FINDINGS: Number of Fetuses: 1 Heart Rate:  152 bpm Movement: Yes Presentation: Variable Previa: No Placental Location: Anterior Amniotic Fluid (Subjective): Normal Amniotic Fluid (Objective): Vertical pocket 3.6cm FETAL BIOMETRY BPD:  4.06cm 18w 2d HC:    15.71cm 18w ford AC:   14.28cm 19w 4d FL:   2.85cm 18w 5d Current Mean GA: 18w 6d [place] EDC: 09/25/2018 FETAL ANATOMY Lateral Ventricles: Appears normal Thalami/CSP: Appears normal Posterior Fossa:  Appears normal Nuchal Region: Appears normal   NFT= N/A Upper Lip: Appears normal Spine: Not visualized 4 Chamber Heart on Left: Appears normal LVOT: Appears normal RVOT: Appears normal Stomach on Left: Appears normal  3 Vessel Cord: Appears normal Cord Insertion site: Appears normal Kidneys: Appears normal Bladder: Appears normal Extremities: Appears normal Sex: Female Technically difficult due to: Spine difficult to visualize secondary to fetal position. Maternal Findings: Cervix:  Cervix is closed.  Cervical length 4 cm. IMPRESSION: 1. Single live intrauterine pregnancy as detailed above. 2. Spine difficult to visualize secondary to fetal position. Recommend follow-up focused ultrasound for evaluation of the spine. Electronically Signed   By: Elige Ko   On: 04/30/2018 10:38   A: IUP at 18wk 6days with  spotting last night possibly due to bacterial vaginosis No prenatal care   P: Started on Flagyl 500 mgm BID x 7 days-to take with food-no alcohol No intercourse x 7 days Nurses and provider discussed continuing prenatal vitamins and to go to [facility] to get Medicaid and to start prenatal care/WIC  Can  See social services at [facility] regarding getting resources during pregnancy and postpartum or if she decides to place baby for adoption     Procedures: Ultrasound  Discharge Condition: stable  Disposition: Discharge disposition: 01-Home or Self Care       Diet: Regular diet  Discharge Activity: Activity as tolerated   Allergies as of 04/30/2018   No Known Allergies     Medication List    TAKE these medications   metroNIDAZOLE 500 MG tablet Commonly known as:  FLAGYL Take 1 tablet (500 mg total) by mouth 2 (two) times daily.     Continue prenatal vitamins   Total time spent taking care of this patient: 20 minutes  Signed: Farrel Conners 04/30/2018, 11:02 AM

## 2018-04-30 NOTE — OB Triage Note (Signed)
Pt presents with c/o bleeding, starting yesterday, has noted "pink spotting." Pt also reports back pain and abdominal pain that comes and goes since Sunday. Pt tearful, expressing concern over pregnancy, doesn't feel like she's "ready to be a mom." Pt states she "has a lot of stress right now," indicating concern over relationship with FOB. Pt reports decreased fetal movement. Denies leaking of fluid. Toco applied and explained. Doppler: 147. All questions answered.

## 2018-04-30 NOTE — H&P (Signed)
Obstetric H&P   Chief Complaint: Abdominal pain, back pain, pink spotting  Prenatal Care Provider: None  History of Present Illness: 22 y.o. G1P0 [redacted]w[redacted]d by 09/14/2018, by Last Menstrual Period presenting to L&D for intermittent abdominal and back pain that started on Sunday. She also noticed some pink spotting when wiping last night. Denies dysuria and abnormal vaginal discharge. Expresses concern about social situation and relationship with FOB. No loss of fluid. Reports that fetal movement is decreased.   Review of Systems: Review of systems negative unless otherwise noted in HPI  Past Medical History: Past Medical History:  Diagnosis Date  . Medical history non-contributory     Past Surgical History: Past Surgical History:  Procedure Laterality Date  . NO PAST SURGERIES      Past Obstetric History: G1P0  Past Gynecologic History: Unknown  Family History: Unremarkable except as listed in HPI.  Social History: Social History   Socioeconomic History  . Marital status: Single    Spouse name: Not on file  . Number of children: Not on file  . Years of education: Not on file  . Highest education level: Not on file  Occupational History  . Occupation: unemployed  Social Needs  . Financial resource strain: Not on file  . Food insecurity:    Worry: Not on file    Inability: Not on file  . Transportation needs:    Medical: Not on file    Non-medical: Not on file  Tobacco Use  . Smoking status: Former Smoker    Types: Cigars    Last attempt to quit: 04/07/2018    Years since quitting: 0.0  . Smokeless tobacco: Never Used  Substance and Sexual Activity  . Alcohol use: Not Currently  . Drug use: Not Currently    Types: Marijuana  . Sexual activity: Yes  Lifestyle  . Physical activity:    Days per week: Not on file    Minutes per session: Not on file  . Stress: Not on file  Relationships  . Social connections:    Talks on phone: Not on file    Gets together: Not on  file    Attends religious service: Not on file    Active member of club or organization: Not on file    Attends meetings of clubs or organizations: Not on file    Relationship status: Not on file  . Intimate partner violence:    Fear of current or ex partner: Not on file    Emotionally abused: Not on file    Physically abused: Not on file    Forced sexual activity: Not on file  Other Topics Concern  . Not on file  Social History Narrative  . Not on file    Medications: Prior to Admission medications   Not on File    Allergies: No Known Allergies  Physical Exam: Vitals: Blood pressure 111/63, pulse 65, temperature 98.1 F (36.7 C), resp. rate 14, last menstrual period 12/08/2017.  FHT: 147 Toco: quiet  General: NAD HEENT: normocephalic, anicteric Pulmonary: No increased work of breathing Cardiovascular: Regular rate Abdomen: Gravid, non-tender Genitourinary: SSE: cervix closed, small amount of white discharge, no blood seen Extremities: no edema, erythema, or tenderness Neurologic: Grossly intact Psychiatric: mood appropriate, affect full   Assessment: 22 y.o. G1P0 [redacted]w[redacted]d by 09/14/2018, by Last Menstrual Period with abdominal and back pain and pink spotting.  Plan: 1) UA, urine culture, wet prep, GC to determine whether infectious source(s) causing pelvic/back pain.  2) Fetus -  movement appreciated in triage, normal FHR via Doppler.  3) PNL - Blood type --/--/O POS Performed at [facility], [address], [place], [place] [address]  [phone]) // HIV Non Reactive (07/06 2131)   4) Social work consult.  Marcelyn Bruins, CNM 04/30/2018  8:31 AM

## 2018-04-30 NOTE — Discharge Instructions (Signed)
Second Trimester of Pregnancy The second trimester is from week 13 through week 28, month 4 through 6. This is often the time in pregnancy that you feel your best. Often times, morning sickness has lessened or quit. You may have more energy, and you may get hungry more often. Your unborn baby (fetus) is growing rapidly. At the end of the sixth month, he or she is about 9 inches long and weighs about 1 pounds. You will likely feel the baby move (quickening) between 18 and 20 weeks of pregnancy. Follow these instructions at home:  Avoid all smoking, herbs, and alcohol. Avoid drugs not approved by your doctor.  Do not use any tobacco products, including cigarettes, chewing tobacco, and electronic cigarettes. If you need help quitting, ask your doctor. You may get counseling or other support to help you quit.  Only take medicine as told by your doctor. Some medicines are safe and some are not during pregnancy.  Exercise only as told by your doctor. Stop exercising if you start having cramps.  Eat regular, healthy meals.  Wear a good support bra if your breasts are tender.  Do not use hot tubs, steam rooms, or saunas.  Wear your seat belt when driving.  Avoid raw meat, uncooked cheese, and liter boxes and soil used by cats.  Take your prenatal vitamins.  Take 1500-2000 milligrams of calcium daily starting at the 20th week of pregnancy until you deliver your baby.  Try taking medicine that helps you poop (stool softener) as needed, and if your doctor approves. Eat more fiber by eating fresh fruit, vegetables, and whole grains. Drink enough fluids to keep your pee (urine) clear or pale yellow.  Take warm water baths (sitz baths) to soothe pain or discomfort caused by hemorrhoids. Use hemorrhoid cream if your doctor approves.  If you have puffy, bulging veins (varicose veins), wear support hose. Raise (elevate) your feet for 15 minutes, 3-4 times a day. Limit salt in your diet.  Avoid heavy  lifting, wear low heals, and sit up straight.  Rest with your legs raised if you have leg cramps or low back pain.  Visit your dentist if you have not gone during your pregnancy. Use a soft toothbrush to brush your teeth. Be gentle when you floss.  You can have sex (intercourse) unless your doctor tells you not to.  Go to your doctor visits. Get help if:  You feel dizzy.  You have mild cramps or pressure in your lower belly (abdomen).  You have a nagging pain in your belly area.  You continue to feel sick to your stomach (nauseous), throw up (vomit), or have watery poop (diarrhea).  You have bad smelling fluid coming from your vagina.  You have pain with peeing (urination). Get help right away if:  You have a fever.  You are leaking fluid from your vagina.  You have spotting or bleeding from your vagina.  You have severe belly cramping or pain.  You lose or gain weight rapidly.  You have trouble catching your breath and have chest pain.  You notice sudden or extreme puffiness (swelling) of your face, hands, ankles, feet, or legs.  You have not felt the baby move in over an hour.  You have severe headaches that do not go away with medicine.  You have vision changes. This information is not intended to replace advice given to you by your health care provider. Make sure you discuss any questions you have with your health care  provider. Document Released: 10/04/2009 Document Revised: 12/16/2015 Document Reviewed: 09/10/2012 Elsevier Interactive Patient Education  2017 [employer] Prenatal Care WHAT IS PRENATAL CARE? Prenatal care is the process of caring for a pregnant woman before she gives birth. Prenatal care makes sure that she and her baby remain as healthy as possible throughout pregnancy. Prenatal care may be provided by a midwife, family practice health care provider, or a childbirth and pregnancy specialist (obstetrician). Prenatal care may include physical  examinations, testing, treatments, and education on nutrition, lifestyle, and social support services. WHY IS PRENATAL CARE SO IMPORTANT? Early and consistent prenatal care increases the chance that you and your baby will remain healthy throughout your pregnancy. This type of care also decreases a babys risk of being born too early (prematurely), or being born smaller than expected (small for gestational age). Any underlying medical conditions you may have that could pose a risk during your pregnancy are discussed during prenatal care visits. You will also be monitored regularly for any new conditions that may arise during your pregnancy so they can be treated quickly and effectively. WHAT HAPPENS DURING PRENATAL CARE VISITS? Prenatal care visits may include the following: Discussion Tell your health care provider about any new signs or symptoms you have experienced since your last visit. These might include:  Nausea or vomiting.  Increased or decreased level of energy.  Difficulty sleeping.  Back or leg pain.  Weight changes.  Frequent urination.  Shortness of breath with physical activity.  Changes in your skin, such as the development of a rash or itchiness.  Vaginal discharge or bleeding.  Feelings of excitement or nervousness.  Changes in your babys movements.  You may want to write down any questions or topics you want to discuss with your health care provider and bring them with you to your appointment. Examination During your first prenatal care visit, you will likely have a complete physical exam. Your health care provider will often examine your vagina, cervix, and the position of your uterus, as well as check your heart, lungs, and other body systems. As your pregnancy progresses, your health care provider will measure the size of your uterus and your babys position inside your uterus. He or she may also examine you for early signs of labor. Your prenatal visits may also  include checking your blood pressure and, after about 10-12 weeks of pregnancy, listening to your babys heartbeat. Testing Regular testing often includes:  Urinalysis. This checks your urine for glucose, protein, or signs of infection.  Blood count. This checks the levels of white and red blood cells in your body.  Tests for sexually transmitted infections (STIs). Testing for STIs at the beginning of pregnancy is routinely done and is required in many states.  Antibody testing. You will be checked to see if you are immune to certain illnesses, such as rubella, that can affect a developing fetus.  Glucose screen. Around 24-28 weeks of pregnancy, your blood glucose level will be checked for signs of gestational diabetes. Follow-up tests may be recommended.  Group B strep. This is a bacteria that is commonly found inside a womans vagina. This test will inform your health care provider if you need an antibiotic to reduce the amount of this bacteria in your body prior to labor and childbirth.  Ultrasound. Many pregnant women undergo an ultrasound screening around 18-20 weeks of pregnancy to evaluate the health of the fetus and check for any developmental abnormalities.  HIV (human immunodeficiency virus) testing. Early in  your pregnancy, you will be screened for HIV. If you are at high risk for HIV, this test may be repeated during your third trimester of pregnancy.  You may be offered other testing based on your age, personal or family medical history, or other factors. HOW OFTEN SHOULD I PLAN TO SEE MY HEALTH CARE PROVIDER FOR PRENATAL CARE? Your prenatal care check-up schedule depends on any medical conditions you have before, or develop during, your pregnancy. If you do not have any underlying medical conditions, you will likely be seen for checkups:  Monthly, during the first 6 months of pregnancy.  Twice a month during months 7 and 8 of pregnancy.  Weekly starting in the 9th month of  pregnancy and until delivery.  If you develop signs of early labor or other concerning signs or symptoms, you may need to see your health care provider more often. Ask your health care provider what prenatal care schedule is best for you. WHAT CAN I DO TO KEEP MYSELF AND MY BABY AS HEALTHY AS POSSIBLE DURING MY PREGNANCY?  Take a prenatal vitamin containing 400 micrograms (0.4 mg) of folic acid every day. Your health care provider may also ask you to take additional vitamins such as iodine, vitamin D, iron, copper, and zinc.  Take 1500-2000 mg of calcium daily starting at your 20th week of pregnancy until you deliver your baby.  Make sure you are up to date on your vaccinations. Unless directed otherwise by your health care provider: ? You should receive a tetanus, diphtheria, and pertussis (Tdap) vaccination between the 27th and 36th week of your pregnancy, regardless of when your last Tdap immunization occurred. This helps protect your baby from whooping cough (pertussis) after he or she is born. ? You should receive an annual inactivated influenza vaccine (IIV) to help protect you and your baby from influenza. This can be done at any point during your pregnancy.  Eat a well-rounded diet that includes: ? Fresh fruits and vegetables. ? Lean proteins. ? Calcium-rich foods such as milk, yogurt, hard cheeses, and dark, leafy greens. ? Whole grain breads.  Do noteat seafood high in mercury, including: ? Swordfish. ? Tilefish. ? Shark. ? King mackerel. ? More than 6 oz tuna per week.  Do not eat: ? Raw or undercooked meats or eggs. ? Unpasteurized foods, such as soft cheeses (brie, blue, or feta), juices, and milks. ? Lunch meats. ? Hot dogs that have not been heated until they are steaming.  Drink enough water to keep your urine clear or pale yellow. For many women, this may be 10 or more 8 oz glasses of water each day. Keeping yourself hydrated helps deliver nutrients to your baby and  may prevent the start of pre-term uterine contractions.  Do not use any tobacco products including cigarettes, chewing tobacco, or electronic cigarettes. If you need help quitting, ask your health care provider.  Do not drink beverages containing alcohol. No safe level of alcohol consumption during pregnancy has been determined.  Do not use any illegal drugs. These can harm your developing baby or cause a miscarriage.  Ask your health care provider or pharmacist before taking any prescription or over-the-counter medicines, herbs, or supplements.  Limit your caffeine intake to no more than 200 mg per day.  Exercise. Unless told otherwise by your health care provider, try to get 30 minutes of moderate exercise most days of the week. Do not  do high-impact activities, contact sports, or activities with a high risk of  falling, such as horseback riding or downhill skiing.  Get plenty of rest.  Avoid anything that raises your body temperature, such as hot tubs and saunas.  If you own a cat, do not empty its litter box. Bacteria contained in cat feces can cause an infection called toxoplasmosis. This can result in serious harm to the fetus.  Stay away from chemicals such as insecticides, lead, mercury, and cleaning or paint products that contain solvents.  Do not have any X-rays taken unless medically necessary.  Take a childbirth and breastfeeding preparation class. Ask your health care provider if you need a referral or recommendation.  This information is not intended to replace advice given to you by your health care provider. Make sure you discuss any questions you have with your health care provider. Document Released: 07/13/2003 Document Revised: 12/13/2015 Document Reviewed: 09/24/2013 Elsevier Interactive Patient Education  2017 [employer].

## 2018-05-01 LAB — URINE CULTURE: CULTURE: NO GROWTH
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
## 2018-12-18 ENCOUNTER — Encounter (HOSPITAL_COMMUNITY): Payer: Self-pay
